# Patient Record
Sex: Male | Born: 1985 | Race: Black or African American | Hispanic: No | Marital: Married
Health system: Southern US, Community
[De-identification: ages and names within clinical notes are randomized; demographics above are authoritative.]

## PROBLEM LIST (undated history)

## (undated) DIAGNOSIS — N200 Calculus of kidney: Secondary | ICD-10-CM

## (undated) DIAGNOSIS — N289 Disorder of kidney and ureter, unspecified: Secondary | ICD-10-CM

---
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
## 2011-12-06 ENCOUNTER — Emergency Department (HOSPITAL_COMMUNITY)
Admission: EM | Admit: 2011-12-06 | Discharge: 2011-12-06 | Disposition: A | Discharge status: Home / Self Care | Payer: BC Managed Care – PPO | Attending: Emergency Medicine | Admitting: Emergency Medicine

## 2011-12-06 ENCOUNTER — Emergency Department (HOSPITAL_COMMUNITY): Payer: BC Managed Care – PPO

## 2011-12-06 ENCOUNTER — Encounter (HOSPITAL_COMMUNITY): Payer: Self-pay | Admitting: *Deleted

## 2011-12-06 DIAGNOSIS — S161XXA Strain of muscle, fascia and tendon at neck level, initial encounter: Secondary | ICD-10-CM

## 2011-12-06 DIAGNOSIS — Y92410 Unspecified street and highway as the place of occurrence of the external cause: Secondary | ICD-10-CM | POA: Insufficient documentation

## 2011-12-06 DIAGNOSIS — S139XXA Sprain of joints and ligaments of unspecified parts of neck, initial encounter: Secondary | ICD-10-CM | POA: Insufficient documentation

## 2011-12-06 DIAGNOSIS — R079 Chest pain, unspecified: Secondary | ICD-10-CM | POA: Insufficient documentation

## 2011-12-06 MED ORDER — HYDROCODONE-ACETAMINOPHEN 5-325 MG PO TABS
1.0000 | ORAL_TABLET | Freq: Once | ORAL | Status: DC
  Filled 2011-12-06: qty 1

## 2011-12-06 MED ORDER — KETOROLAC TROMETHAMINE 30 MG/ML IJ SOLN
30.0000 mg | Freq: Once | INTRAMUSCULAR | Status: AC
  Administered 2011-12-06: 30 mg via INTRAVENOUS
  Filled 2011-12-06: qty 1

## 2011-12-06 MED ORDER — HYDROCODONE-ACETAMINOPHEN 5-325 MG PO TABS
1.0000 | ORAL_TABLET | Freq: Four times a day (QID) | ORAL | Status: AC | PRN
Start: 2011-12-06 — End: 2011-12-16

## 2011-12-06 MED ORDER — MORPHINE SULFATE 4 MG/ML IJ SOLN
4.0000 mg | Freq: Once | INTRAMUSCULAR | Status: AC
  Administered 2011-12-06: 4 mg via INTRAVENOUS

## 2011-12-06 MED ORDER — IBUPROFEN 800 MG PO TABS: 800.0000 mg | ORAL_TABLET | Freq: Three times a day (TID) | ORAL | Status: AC | PRN

## 2011-12-06 MED ORDER — MORPHINE SULFATE 4 MG/ML IJ SOLN: 4.0000 mg | Freq: Once | INTRAMUSCULAR | Status: DC

## 2011-12-06 MED ORDER — MORPHINE SULFATE 4 MG/ML IJ SOLN
INTRAMUSCULAR | Status: AC
  Administered 2011-12-06: 4 mg via INTRAVENOUS
  Filled 2011-12-06: qty 1

## 2011-12-06 NOTE — ED Notes (Signed)
SB cleared by Public affairs consultant, PA

## 2011-12-06 NOTE — ED Notes (Signed)
c-collar cleared by lawyer, PA and removed by [place], EMT

## 2011-12-06 NOTE — Discharge Instructions (Signed)
Return here as needed. You will more sore tomorrow and the next 7-10 days. Ice and heat on the areas that are sore.
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
## 2011-12-09 NOTE — ED Provider Notes (Signed)
Medical screening examination/treatment/procedure(s) were performed by non-physician practitioner and as supervising physician I was immediately available for consultation/collaboration.   Glynn Octave, MD 12/09/11 2131

## 2012-03-06 ENCOUNTER — Encounter (HOSPITAL_COMMUNITY): Payer: Self-pay | Admitting: *Deleted

## 2012-03-06 ENCOUNTER — Emergency Department (HOSPITAL_COMMUNITY)
Admission: EM | Admit: 2012-03-06 | Discharge: 2012-03-06 | Disposition: A | Discharge status: Home / Self Care | Payer: BC Managed Care – PPO | Attending: Emergency Medicine | Admitting: Emergency Medicine

## 2012-03-06 DIAGNOSIS — H5710 Ocular pain, unspecified eye: Secondary | ICD-10-CM

## 2012-03-06 MED ORDER — TETRACAINE HCL 0.5 % OP SOLN
1.0000 [drp] | Freq: Once | OPHTHALMIC | Status: DC
  Filled 2012-03-06 (×2): qty 2

## 2012-03-06 MED ORDER — NAPROXEN 500 MG PO TABS: 500.0000 mg | ORAL_TABLET | Freq: Two times a day (BID) | ORAL | Status: AC

## 2012-03-06 MED ORDER — FLUORESCEIN SODIUM 1 MG OP STRP
1.0000 | ORAL_STRIP | Freq: Once | OPHTHALMIC | Status: DC
  Filled 2012-03-06 (×2): qty 1

## 2012-03-06 NOTE — ED Notes (Signed)
The pt has lt eye pain.  He was working in the [employer] and a piece of wood from a palate flew into his eye

## 2012-03-06 NOTE — ED Notes (Signed)
Discharge instructions given  Voiced understanding.   

## 2012-03-06 NOTE — Discharge Instructions (Signed)
Your eye exam is normal, there is no scratches, there does not appear to be any injuries. She should develop redness swelling pus or fevers or change in her vision return to the hospital immediately for a recheck, see the eye doctor in 2 days if you're still having any discomfort in her eye. See a followup list below for a family Dr.  Sheila Oats GUIDE  Dental Problems  Patients with Medicaid: [facility] [id number]                                           [phone] [address] Phone:  [phone]                                                  Phone:  [phone]  If unable to pay or uninsured, contact:  [facility] or [facility]. to become qualified for the adult dental clinic.  Chronic Pain Problems Contact [facility]  [phone] Patients need to be referred by their primary care doctor.  Insufficient Money for Medicine Contact [employer]:  call "211" or [facility] [phone].  No Primary Care Doctor Call [facility]  [phone] Other agencies that provide inexpensive medical care    [facility]  [phone]    [facility]  [phone]    [facility]  [phone]    [facility]  [phone]    [facility]  [phone]    [facility]  [phone]  Psychological Services [facility] Behavioral Health  [phone] [facility] Services  [phone] [facility] Mental Health   [phone] (emergency services [phone])  Substance Abuse Resources [facility]  ([phone] [facility] [phone] [facility] [phone] Floydene Flock [phone] Residential & Outpatient Substance Abuse Program  [phone]  Abuse/Neglect [facility] Child Abuse Hotline (316) [phone] [facility] Child Abuse Hotline 3097465213 (After Hours)  Emergency Shelter [facility] [phone]  Maternity Homes [facility] [place] [facility] [place] [phone] [facility] [phone]  MRSA Hotline #:   (220)456-2641    [facility] Resources  [facility] [place]     [employer]                          [facility] Dept. [address] [place]                       [address]      [address]  [place]                                                Cristobal Goldmann Phone:  [phone]                                   Phone:  [phone]                 Phone:  [phone]  [facility] Mental Health Phone:  [phone]  [facility] Child Abuse Hotline (305)610-0142 7203623863 (After Hours)

## 2012-03-06 NOTE — ED Provider Notes (Signed)
History     CSN: [id number]  Arrival date & time 03/06/12  0155   First MD Initiated Contact with Patient 03/06/12 0202      Chief Complaint  Patient presents with  . 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 This was acute in onset, the pain is constant, mild, not associated with blurred vision, double vision, headache, redness, discharge. There is mild tearing in the eye. Symptoms are persistent and nothing makes better or worse. He denies photophobia  Patient is a 26 y.o. male presenting with eye pain. The history is provided by the patient.  Eye Pain Pertinent negatives include no headaches.    History reviewed. No pertinent past medical history.  History reviewed. No pertinent past surgical history.  No family history on file.  History  Substance Use Topics  . Smoking status: Not on file  . Smokeless tobacco: Not on file  . Alcohol Use: No      Review of Systems  Eyes: Positive for pain. Negative for photophobia, discharge, redness and visual disturbance.  Gastrointestinal: Negative for vomiting.  Neurological: Negative for headaches.    Allergies  Review of patient's allergies indicates no known allergies.  Home Medications   Current Outpatient Rx  Name Route Sig Dispense Refill  . DIPHENHYDRAMINE HCL 25 MG PO TABS Oral Take 50 mg by mouth at bedtime.    Marland Kitchen LORATADINE 10 MG PO TABS Oral Take 10 mg by mouth daily.    Marland Kitchen PSEUDOEPH-DOXYLAMINE-DM-APAP 60-7.03-08-999 MG/30ML PO LIQD Oral Take 15 mLs by mouth at bedtime as needed. For congestion/cough    . NAPROXEN 500 MG PO TABS Oral Take 1 tablet (500 mg total) by mouth 2 (two) times daily with a meal. 30 tablet 0    BP  132/90  Pulse 75  Temp(Src) 97.7 F (36.5 C) (Oral)  Resp 16  SpO2 99%  Physical Exam  Nursing note and vitals reviewed. Constitutional:       Well-appearing, in no acute distress  HENT:       Normocephalic, atraumatic, oropharynx clear and moist, no trismus, mucous membranes moist, no significant facial trauma  Eyes:       Extraocular movements are normal, pupillary exam is normal, pupils are 5 mm and briskly responsive bilaterally, there is no a very pupillary defect, there is no consensual pain, there is no diplopia, there is normal extraocular movements and normal peripheral visual fields. Visual acuity is grossly normal, tetracaine and fluorescein exam shows no significant abrasions, contusions, normal pupillary exam, conjunctiva are totally clear, there is no injection, tetracaine and fluorescein and slit lamp exams all normal.  Neck: Normal range of motion. Neck supple.  Pulmonary/Chest: Effort normal.  Neurological: He is alert.       Speech is clear, gait is normal  Skin: No rash noted.    ED Course  Procedures (including critical care time)  Labs Reviewed - No data to display No results found.   1. Eye pain       MDM  No eye injuries evident on exam, patient well appearing, given instructions to followup, ophthalmology phone number, no medications indicated other than an anti-inflammatory at this time        Vida Roller, MD  03/06/12 0227 

## 2016-06-25 ENCOUNTER — Encounter (HOSPITAL_COMMUNITY): Payer: Self-pay | Admitting: *Deleted

## 2016-06-25 ENCOUNTER — Ambulatory Visit (HOSPITAL_COMMUNITY)
Admission: EM | Admit: 2016-06-25 | Discharge: 2016-06-25 | Disposition: A | Discharge status: Home / Self Care | Payer: Self-pay | Attending: Family Medicine | Admitting: Family Medicine

## 2016-06-25 ENCOUNTER — Ambulatory Visit (INDEPENDENT_AMBULATORY_CARE_PROVIDER_SITE_OTHER): Payer: Self-pay

## 2016-06-25 DIAGNOSIS — J069 Acute upper respiratory infection, unspecified: Secondary | ICD-10-CM

## 2016-06-25 DIAGNOSIS — R509 Fever, unspecified: Secondary | ICD-10-CM | POA: Insufficient documentation

## 2016-06-25 DIAGNOSIS — B9789 Other viral agents as the cause of diseases classified elsewhere: Secondary | ICD-10-CM

## 2016-06-25 DIAGNOSIS — Z79899 Other long term (current) drug therapy: Secondary | ICD-10-CM | POA: Insufficient documentation

## 2016-06-25 DIAGNOSIS — R05 Cough: Secondary | ICD-10-CM | POA: Insufficient documentation

## 2016-06-25 LAB — POCT RAPID STREP A: STREPTOCOCCUS, GROUP A SCREEN (DIRECT): NEGATIVE

## 2016-06-25 MED ORDER — IPRATROPIUM BROMIDE 0.06 % NA SOLN: 2.0000 | Freq: Four times a day (QID) | NASAL | 1 refills | Status: DC

## 2016-06-25 NOTE — ED Provider Notes (Signed)
[facility]    CSN: [id number] Arrival date & time: 06/25/16  1247  First Provider Contact:  First MD Initiated Contact with Patient 06/25/16 1426        History   Chief Complaint Chief Complaint  Patient presents with  . Cough    HPI Michael Weber is a 30 y.o. male.   The history is provided by the patient and the spouse.  Cough  Cough characteristics:  Non-productive Severity:  Moderate Onset quality:  Sudden Duration:  2 days Progression:  Unchanged Chronicity:  New Smoker: no   Context: sick contacts   Relieved by:  Nothing Worsened by:  Nothing Ineffective treatments:  None tried Associated symptoms: fever, myalgias and sore throat   Associated symptoms: no rhinorrhea and no shortness of breath     History reviewed. No pertinent past medical history.  There are no active problems to display for this patient.   History reviewed. No pertinent surgical history.     Home Medications    Prior to Admission medications   Medication Sig Start Date End Date Taking? Authorizing Provider  diphenhydrAMINE (BENADRYL) 25 MG tablet Take 50 mg by mouth at bedtime.    Historical Provider, MD  loratadine (CLARITIN) 10 MG tablet Take 10 mg by mouth daily.    Historical Provider, MD  Pseudoeph-Doxylamine-DM-APAP (NYQUIL) 60-7.03-08-999 MG/30ML LIQD Take 15 mLs by mouth at bedtime as needed. For congestion/cough    Historical Provider, MD    Family History History reviewed. No pertinent family history.  Social History Social History  Substance Use Topics  . Smoking status: Not on file  . Smokeless tobacco: Not on file  . Alcohol use No     Allergies   Review of patient's allergies indicates no known allergies.   Review of Systems Review of Systems  Constitutional: Positive for fever.  HENT: Positive for congestion, postnasal drip and sore throat. Negative for rhinorrhea.   Respiratory: Positive for cough. Negative for shortness of breath.     Cardiovascular: Negative.   Gastrointestinal: Negative.   Musculoskeletal: Positive for myalgias.  Skin: Negative.   All other systems reviewed and are negative.    Physical Exam Triage Vital Signs ED Triage Vitals  Enc Vitals Group     BP 06/25/16 1405 119/80     Pulse Rate 06/25/16 1405 96     Resp 06/25/16 1405 12     Temp 06/25/16 1405 100.3 F (37.9 C)     Temp Source 06/25/16 1405 Oral     SpO2 06/25/16 1405 100 %     Weight --      Height --      Head Circumference --      Peak Flow --      Pain Score 06/25/16 1418 8     Pain Loc --      Pain Edu? --      Excl. in GC? --    No data found.   Updated Vital Signs BP 119/80 (BP Location: Left Arm)   Pulse 96   Temp 100.3 F (37.9 C) (Oral)   Resp 12   SpO2 100%   Visual Acuity Right Eye Distance:   Left Eye Distance:   Bilateral Distance:    Right Eye Near:   Left Eye Near:    Bilateral Near:     Physical Exam  Constitutional: He is oriented to person, place, and time. He appears well-developed and well-nourished. He appears distressed.  HENT:  Head: Normocephalic.  Right Ear: External ear normal.  Left Ear: External ear normal.  Neck: Normal range of motion. Neck supple.  Cardiovascular: Normal rate and normal heart sounds.   Pulmonary/Chest: Effort normal and breath sounds normal.  Lymphadenopathy:    He has no cervical adenopathy.  Neurological: He is alert and oriented to person, place, and time.  Nursing note and vitals reviewed.    UC Treatments / Results  Labs (all labs ordered are listed, but only abnormal results are displayed) Labs Reviewed - No data to display dtrep neg.  EKG  EKG Interpretation None       Radiology No results found. X-rays reviewed and report per radiologist.  Procedures Procedures (including critical care time)  Medications Ordered in UC Medications - No data to display   Initial Impression / Assessment and Plan / UC Course  I have reviewed the  triage vital signs and the nursing notes.  Pertinent labs & imaging results that were available during my care of the patient were reviewed by me and considered in my medical decision making (see chart for details).  Clinical Course      Final Clinical Impressions(s) / UC Diagnoses   Final diagnoses:  None    New Prescriptions New Prescriptions   No medications on file     Linna Hoff, MD 06/25/16 1512

## 2016-06-25 NOTE — ED Triage Notes (Signed)
Pt  Reports   Symptoms  Of  Cough  /  Congested      With     Body  Aches  And  Fever     Pt  Ambulated  To  Room  With    Steady  Fluid     Gait

## 2016-06-28 LAB — CULTURE, GROUP A STREP (THRC)

## 2016-08-29 ENCOUNTER — Encounter (HOSPITAL_BASED_OUTPATIENT_CLINIC_OR_DEPARTMENT_OTHER): Payer: Self-pay | Admitting: *Deleted

## 2016-08-29 ENCOUNTER — Emergency Department (HOSPITAL_BASED_OUTPATIENT_CLINIC_OR_DEPARTMENT_OTHER)
Admission: EM | Admit: 2016-08-29 | Discharge: 2016-08-29 | Disposition: A | Discharge status: Home / Self Care | Payer: Self-pay | Attending: Emergency Medicine | Admitting: Emergency Medicine

## 2016-08-29 ENCOUNTER — Emergency Department (HOSPITAL_BASED_OUTPATIENT_CLINIC_OR_DEPARTMENT_OTHER): Payer: Self-pay

## 2016-08-29 DIAGNOSIS — N23 Unspecified renal colic: Secondary | ICD-10-CM | POA: Insufficient documentation

## 2016-08-29 HISTORY — DX: Disorder of kidney and ureter, unspecified: N28.9

## 2016-08-29 HISTORY — DX: Calculus of kidney: N20.0

## 2016-08-29 LAB — URINALYSIS, ROUTINE W REFLEX MICROSCOPIC
BILIRUBIN URINE: NEGATIVE
Glucose, UA: NEGATIVE mg/dL
Ketones, ur: NEGATIVE mg/dL
NITRITE: NEGATIVE
PH: 7 (ref 5.0–8.0)
Protein, ur: NEGATIVE mg/dL
SPECIFIC GRAVITY, URINE: 1.018 (ref 1.005–1.030)

## 2016-08-29 LAB — COMPREHENSIVE METABOLIC PANEL
ALBUMIN: 4.2 g/dL (ref 3.5–5.0)
ALK PHOS: 76 U/L (ref 38–126)
ALT: 18 U/L (ref 17–63)
AST: 21 U/L (ref 15–41)
Anion gap: 7 (ref 5–15)
BUN: 11 mg/dL (ref 6–20)
CALCIUM: 9.2 mg/dL (ref 8.9–10.3)
CO2: 27 mmol/L (ref 22–32)
CREATININE: 1.14 mg/dL (ref 0.61–1.24)
Chloride: 104 mmol/L (ref 101–111)
GFR calc Af Amer: >60 mL/min (ref >60)
GFR calc non Af Amer: >60 mL/min (ref >60)
GLUCOSE: 130 mg/dL — AB (ref 65–99)
Potassium: 4 mmol/L (ref 3.5–5.1)
SODIUM: 138 mmol/L (ref 135–145)
Total Bilirubin: 0.5 mg/dL (ref 0.3–1.2)
Total Protein: 7.4 g/dL (ref 6.5–8.1)

## 2016-08-29 LAB — URINE MICROSCOPIC-ADD ON

## 2016-08-29 LAB — CBC WITH DIFFERENTIAL/PLATELET
BASOS PCT: 1 %
Basophils Absolute: 0 10*3/uL (ref 0.0–0.1)
EOS ABS: 0.1 10*3/uL (ref 0.0–0.7)
Eosinophils Relative: 2 %
HCT: 43.3 % (ref 39.0–52.0)
HEMOGLOBIN: 14.4 g/dL (ref 13.0–17.0)
Lymphocytes Relative: 21 %
Lymphs Abs: 1.1 10*3/uL (ref 0.7–4.0)
MCH: 27.5 pg (ref 26.0–34.0)
MCHC: 33.3 g/dL (ref 30.0–36.0)
MCV: 82.6 fL (ref 78.0–100.0)
Monocytes Absolute: 0.5 10*3/uL (ref 0.1–1.0)
Monocytes Relative: 10 %
NEUTROS ABS: 3.4 10*3/uL (ref 1.7–7.7)
NEUTROS PCT: 66 %
Platelets: 258 10*3/uL (ref 150–400)
RBC: 5.24 MIL/uL (ref 4.22–5.81)
RDW: 13.8 % (ref 11.5–15.5)
WBC: 5.2 10*3/uL (ref 4.0–10.5)

## 2016-08-29 LAB — LIPASE, BLOOD: Lipase: 41 U/L (ref 11–51)

## 2016-08-29 MED ORDER — KETOROLAC TROMETHAMINE 30 MG/ML IJ SOLN
30.0000 mg | Freq: Once | INTRAMUSCULAR | Status: AC
  Administered 2016-08-29: 30 mg via INTRAVENOUS
  Filled 2016-08-29: qty 1

## 2016-08-29 MED ORDER — IBUPROFEN 800 MG PO TABS: 800.0000 mg | ORAL_TABLET | Freq: Three times a day (TID) | ORAL | 0 refills | Status: AC

## 2016-08-29 MED ORDER — SODIUM CHLORIDE 0.9 % IV BOLUS (SEPSIS)
1000.0000 mL | Freq: Once | INTRAVENOUS | Status: AC
  Administered 2016-08-29: 1000 mL via INTRAVENOUS

## 2016-08-29 MED ORDER — METOCLOPRAMIDE HCL 10 MG PO TABS: 10.0000 mg | ORAL_TABLET | Freq: Four times a day (QID) | ORAL | 0 refills | Status: AC | PRN

## 2016-08-29 MED ORDER — ONDANSETRON HCL 4 MG/2ML IJ SOLN
4.0000 mg | Freq: Once | INTRAMUSCULAR | Status: AC
  Administered 2016-08-29: 4 mg via INTRAVENOUS
  Filled 2016-08-29: qty 2

## 2016-08-29 MED ORDER — TAMSULOSIN HCL 0.4 MG PO CAPS: 0.4000 mg | ORAL_CAPSULE | Freq: Every day | ORAL | 0 refills | Status: AC

## 2016-08-29 MED ORDER — OXYCODONE-ACETAMINOPHEN 5-325 MG PO TABS: 1.0000 | ORAL_TABLET | ORAL | 0 refills | Status: AC | PRN

## 2016-08-29 MED ORDER — MORPHINE SULFATE (PF) 4 MG/ML IV SOLN
4.0000 mg | Freq: Once | INTRAVENOUS | Status: AC
  Administered 2016-08-29: 4 mg via INTRAVENOUS
  Filled 2016-08-29: qty 1

## 2016-08-29 MED FILL — IBUPROFEN 800 MG TABLET: 800 | 7 days supply | Qty: 21 | Fill #0

## 2016-08-29 MED FILL — OXYCODONE/APAP 5/325 MG TAB: 5-325 | 2 days supply | Qty: 10 | Fill #0

## 2016-08-29 MED FILL — TAMSULOSIN HCL 0.4 MG CAP: 0.4 | 15 days supply | Qty: 15 | Fill #0

## 2016-08-29 MED FILL — METOCLOPRAMIDE 10 MG TABLET: 10 | 3 days supply | Qty: 10 | Fill #0

## 2016-08-29 NOTE — Discharge Instructions (Signed)
Stay hydrated.   Take motrin for pain.   Take percocet for severe pain. DO NOT drive with it.   Take flomax daily.   Take reglan for nausea or vomiting.   See urologist  Return to ER if you have severe pain, vomiting, fevers.

## 2016-08-29 NOTE — ED Triage Notes (Signed)
C/o right sided abd pain with n/v that started this am. Also c/o low back pain. No fever. Last NL BM was Friday.

## 2016-08-29 NOTE — ED Provider Notes (Signed)
[facility]   CSN: [id number] Arrival date & time: 08/29/16  0805     History   Chief Complaint Chief Complaint  Patient presents with  . Abdominal Pain    HPI Michael Weber is a 30 y.o. male history kidney stone who presenting with right lower quadrant pain, right flank pain. Acute onset of severe right periumbilical and right lower quadrant pain with radiation to the flank this morning. Associated with several episodes of vomiting. Denies any fevers at home. He was also noted to be constipated and last bowel movement was 3 days ago. Denies any blood in his urine and denies any pain with urination. Patient has a history of kidney stones with similar symptoms but never had any abdominal surgeries in the past.    The history is provided by the patient.    Past Medical History:  Diagnosis Date  . Renal disorder   . Renal stones     There are no active problems to display for this patient.   No past surgical history on file.     Home Medications    Prior to Admission medications   Not on File    Family History No family history on file.  Social History Social History  Substance Use Topics  . Smoking status: Not on file  . Smokeless tobacco: Not on file  . Alcohol use No     Allergies   Patient has no known allergies.   Review of Systems Review of Systems  Gastrointestinal: Positive for abdominal pain.  All other systems reviewed and are negative.    Physical Exam Updated Vital Signs BP 123/88 (BP Location: Right Arm)   Pulse 74   Temp 97.9 F (36.6 C) (Oral)   Resp 18   Ht 5\' 7"  (1.702 m)   Wt 143 lb (64.9 kg)   SpO2 100%   BMI 22.40 kg/m   Physical Exam  Constitutional: He is oriented to person, place, and time.  Uncomfortable   HENT:  Head: Normocephalic.  MM dry   Eyes: EOM are normal. Pupils are equal, round, and reactive to light.  Neck: Normal range of motion. Neck supple.  Cardiovascular: Normal  rate, regular rhythm and normal heart sounds.   Pulmonary/Chest: Effort normal and breath sounds normal. No respiratory distress. He has no wheezes.  Abdominal: Soft. Bowel sounds are normal.  Mild RLQ and R CVAT   Musculoskeletal: Normal range of motion.  Neurological: He is alert and oriented to person, place, and time.  Skin: Skin is warm.  Psychiatric: He has a normal mood and affect.  Nursing note and vitals reviewed.    ED Treatments / Results  Labs (all labs ordered are listed, but only abnormal results are displayed) Labs Reviewed  COMPREHENSIVE METABOLIC PANEL - Abnormal; Notable for the following:       Result Value   Glucose, Bld 130 (*)    All other components within normal limits  URINALYSIS, ROUTINE W REFLEX MICROSCOPIC (NOT AT [facility]) - Abnormal; Notable for the following:    Hgb urine dipstick LARGE (*)    Leukocytes, UA SMALL (*)    All other components within normal limits  URINE MICROSCOPIC-ADD ON - Abnormal; Notable for the following:    Squamous Epithelial / LPF 0-5 (*)    Bacteria, UA FEW (*)    All other components within normal limits  CBC WITH DIFFERENTIAL/PLATELET  LIPASE, BLOOD    EKG  EKG Interpretation None  Radiology Ct Renal Stone Study  Result Date: 08/29/2016 CLINICAL DATA:  Right flank pain, right lower quadrant pain, nausea beginning this morning. EXAM: CT ABDOMEN AND PELVIS WITHOUT CONTRAST TECHNIQUE: Multidetector CT imaging of the abdomen and pelvis was performed following the standard protocol without IV contrast. COMPARISON:  None. FINDINGS: Lower chest: Lung bases are clear. No effusions. Heart is normal size. Hepatobiliary: No focal hepatic abnormality. Gallbladder unremarkable. Pancreas: No focal abnormality or ductal dilatation. Spleen: No focal abnormality.  Normal size. Adrenals/Urinary Tract: Mild right hydronephrosis. 4 mm stone in the mid right ureter. Punctate nonobstructing stones in the kidneys bilaterally. No  hydronephrosis on the left. Urinary bladder and adrenal glands unremarkable. Stomach/Bowel: Stomach, large and small bowel grossly unremarkable. Vascular/Lymphatic: No evidence of aneurysm or adenopathy. Reproductive: No visible focal abnormality. Other: No free fluid or free air. Musculoskeletal: No acute bony abnormality. IMPRESSION: 4 mm mid right ureteral stone with mild right hydronephrosis. Electronically Signed   By: Charlett NoseKevin  Dover M.D.   On: 08/29/2016 09:53    Procedures Procedures (including critical care time)  Medications Ordered in ED Medications  sodium chloride 0.9 % bolus 1,000 mL (0 mLs Intravenous Stopped 08/29/16 0933)  ondansetron (ZOFRAN) injection 4 mg (4 mg Intravenous Given 08/29/16 0850)  morphine 4 MG/ML injection 4 mg (4 mg Intravenous Given 08/29/16 0852)  ketorolac (TORADOL) 30 MG/ML injection 30 mg (30 mg Intravenous Given 08/29/16 0855)     Initial Impression / Assessment and Plan / ED Course  I have reviewed the triage vital signs and the nursing notes.  Pertinent labs & imaging results that were available during my care of the patient were reviewed by me and considered in my medical decision making (see chart for details).  Clinical Course     Michael Weber is a 30 y.o. male here with RLQ pain, R flank pain. Likely appy vs renal colic. Will get labs, UA. If UA showed blood, will get CT renal stone. Otherwise, will get CT ab/pel.   10:30 AM Labs unremarkable. UA + blood. CT renal stone with 4 mm R ureteral stone with mild hydro. Pain controlled. Will dc home with motrin, flomax, percocet, urology referral.    Final Clinical Impressions(s) / ED Diagnoses   Final diagnoses:  None    New Prescriptions New Prescriptions   No medications on file     Charlynne Panderavid Hsienta Yao, MD 08/29/16 1034

## 2018-02-12 IMAGING — CT CT RENAL STONE PROTOCOL
2 of 4 series · 16 of 46 positions shown, 18 images · non-contrast
Comparison: None.

CLINICAL DATA: Right flank pain, right lower quadrant pain, nausea
beginning this morning.

EXAM:
CT ABDOMEN AND PELVIS WITHOUT CONTRAST
TECHNIQUE: Multidetector CT imaging of the abdomen and pelvis was performed
following the standard protocol without IV contrast.

[Series 2: axial st · axial · 0.72mm/px · z∈[-520,-95]mm · 13 of 93 slices shown, 15 images]
[im 4/93  soft-tissue]
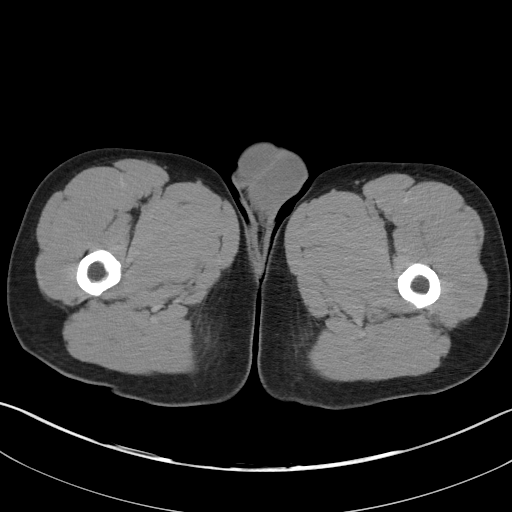
[im 4/93  bone]
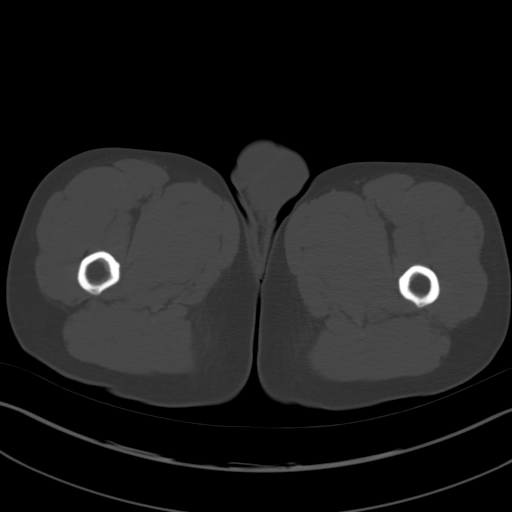
[im 12/93  soft-tissue]
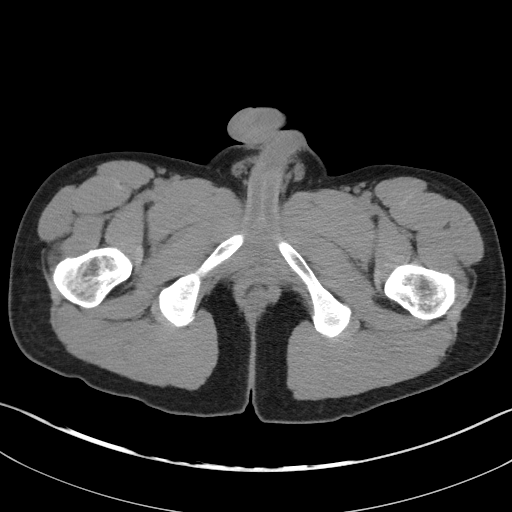
[im 20/93  soft-tissue]
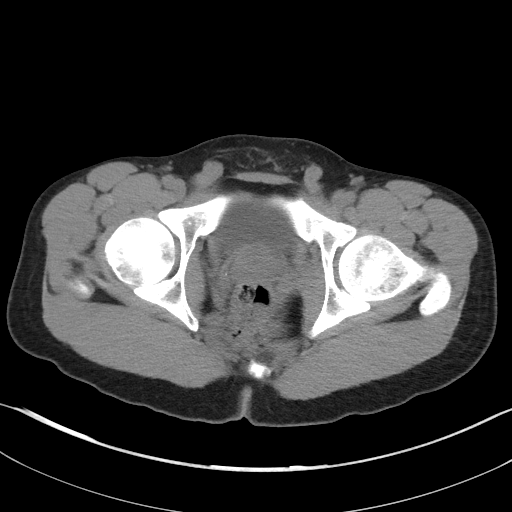
[im 27/93  soft-tissue]
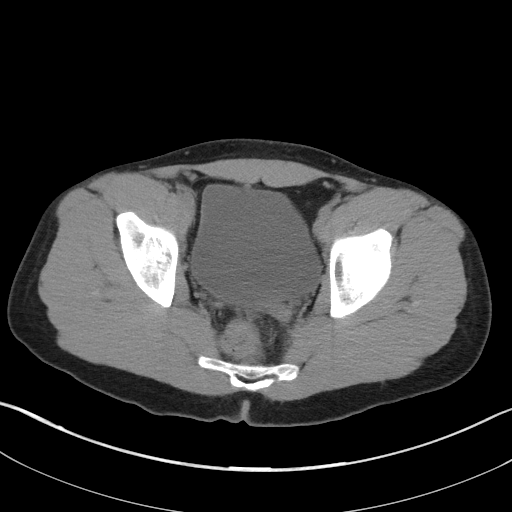
[im 31/93  soft-tissue]
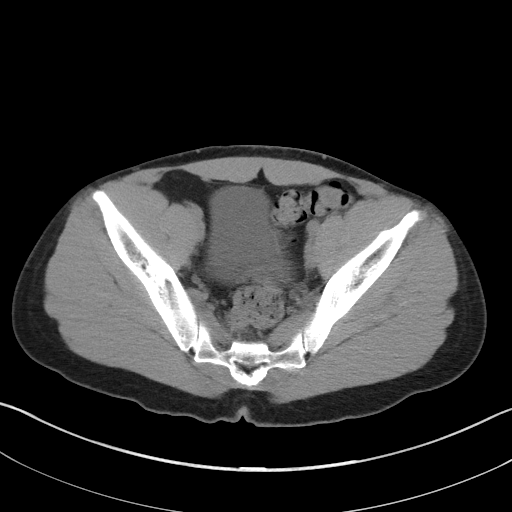
[im 39/93  soft-tissue]
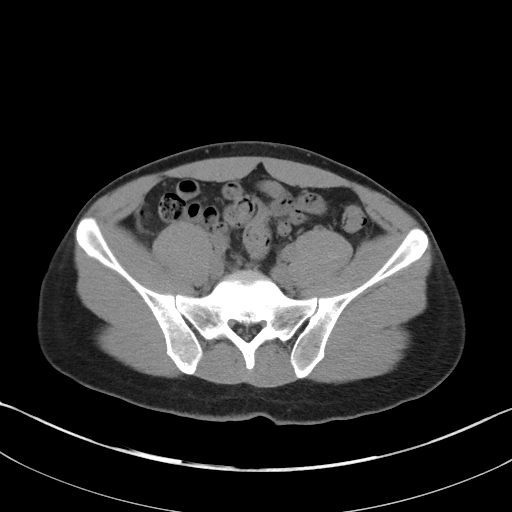
[im 47/93  soft-tissue]
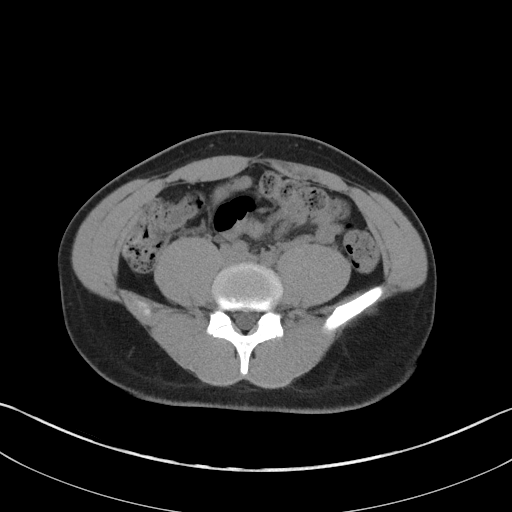
[im 54/93  soft-tissue]
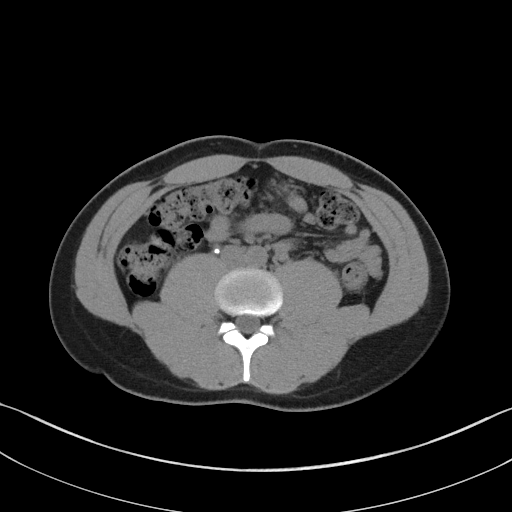
[im 62/93  soft-tissue]
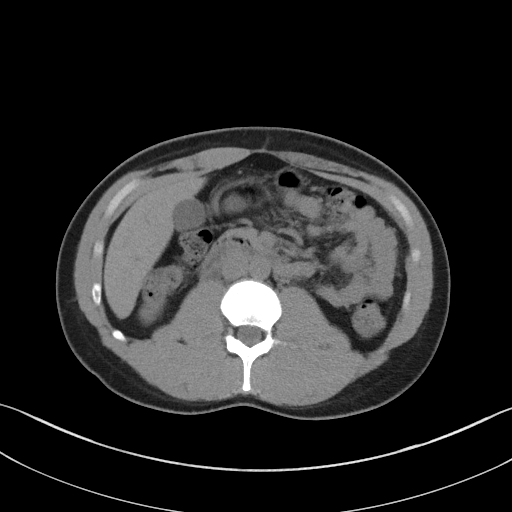
[im 62/93  bone]
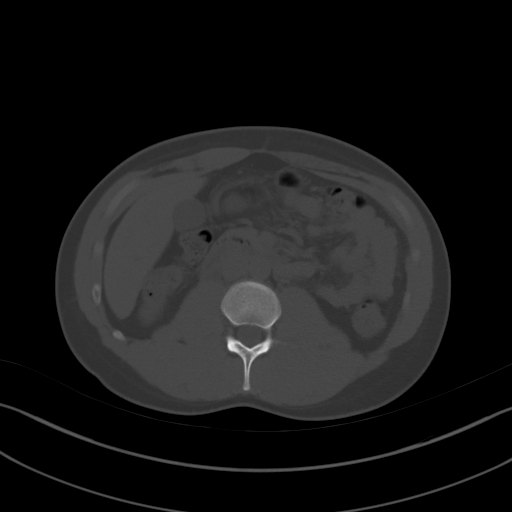
[im 66/93  soft-tissue]
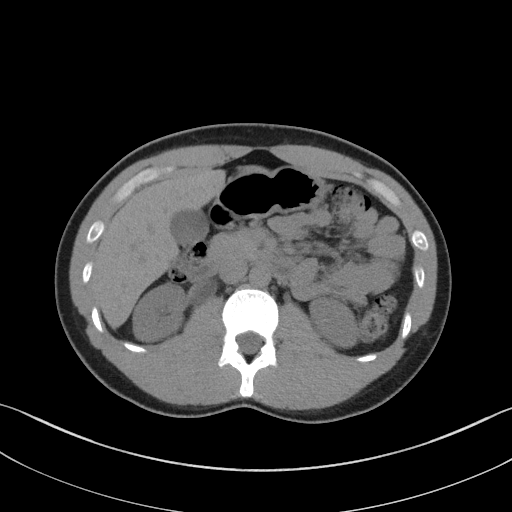
[im 73/93  soft-tissue]
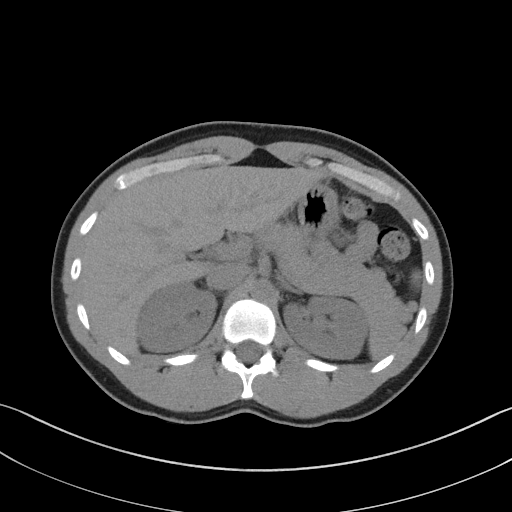
[im 81/93  soft-tissue]
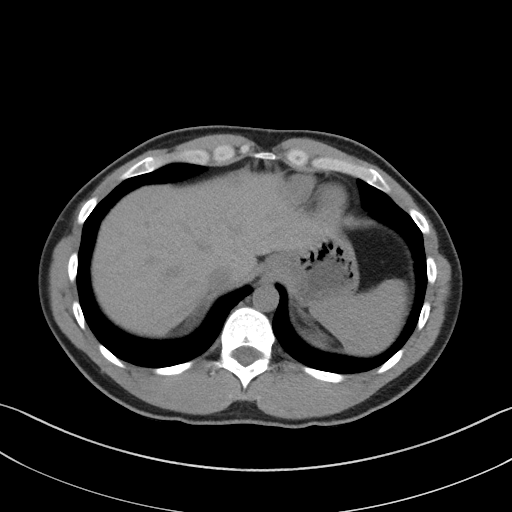
[im 89/93  soft-tissue]
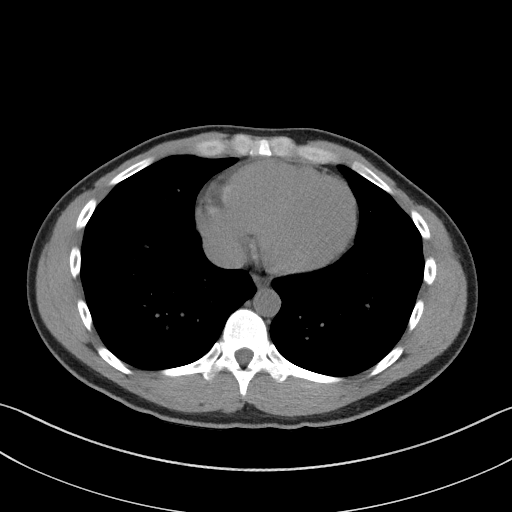

[Series 5: coronal st · coronal · 0.67mm/px · 3 of 74 slices shown]
[im 25/74  soft-tissue]
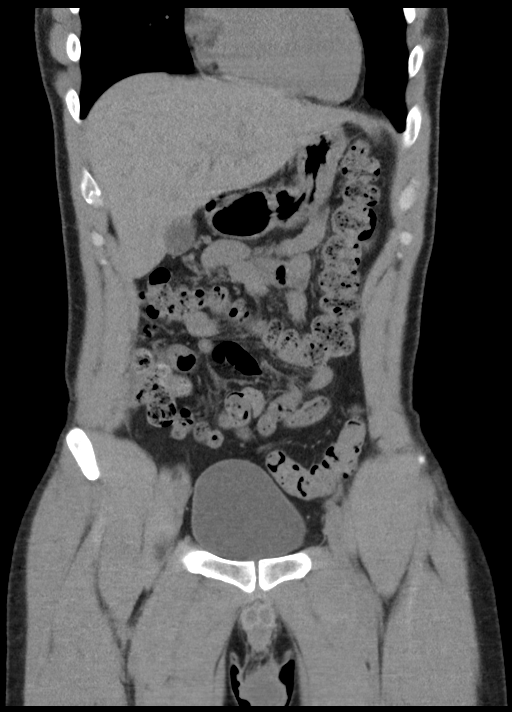
[im 33/74  soft-tissue]
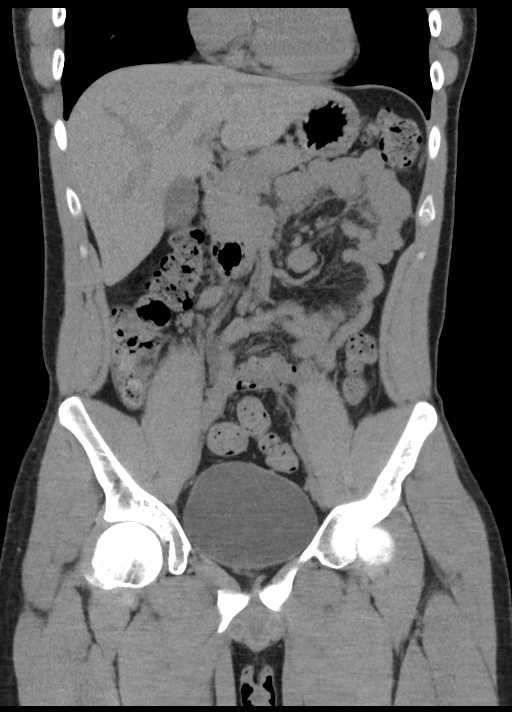
[im 41/74  soft-tissue]
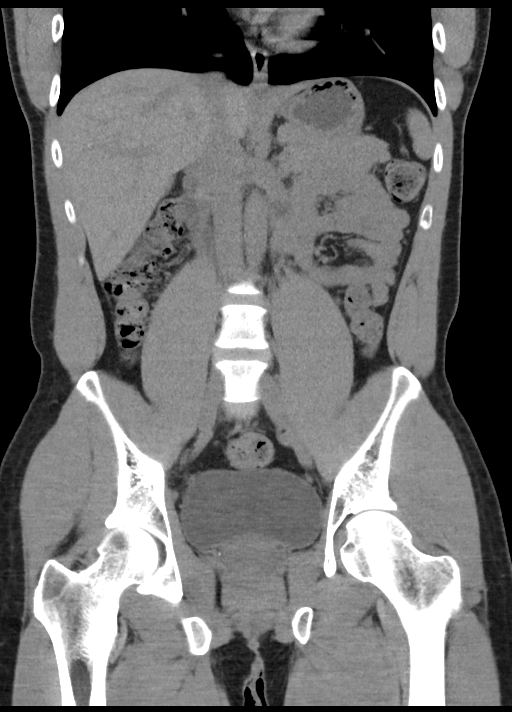

[16 of 46 positions shown; findings below may reference images not displayed]

FINDINGS: Lower chest: Lung bases are clear. No effusions. Heart is normal
size.

Hepatobiliary: No focal hepatic abnormality. Gallbladder
unremarkable.

Pancreas: No focal abnormality or ductal dilatation.

Spleen: No focal abnormality.  Normal size.

Adrenals/Urinary Tract: Mild right hydronephrosis. 4 mm stone in the
mid right ureter. Punctate nonobstructing stones in the kidneys
bilaterally. No hydronephrosis on the left. Urinary bladder and
adrenal glands unremarkable.

Stomach/Bowel: Stomach, large and small bowel grossly unremarkable.

Vascular/Lymphatic: No evidence of aneurysm or adenopathy.

Reproductive: No visible focal abnormality.

Other: No free fluid or free air.

Musculoskeletal: No acute bony abnormality.
IMPRESSION: 4 mm mid right ureteral stone with mild right hydronephrosis.
